# Patient Record
Sex: Female | Born: 1965 | Race: White | Hispanic: No | Marital: Married | Smoking: Never smoker
Health system: Southern US, Community
[De-identification: ages and names within clinical notes are randomized; demographics above are authoritative.]

## PROBLEM LIST (undated history)

## (undated) DIAGNOSIS — I10 Essential (primary) hypertension: Secondary | ICD-10-CM

## (undated) HISTORY — DX: Essential (primary) hypertension: I10

## (undated) HISTORY — PX: CHOLECYSTECTOMY: SHX55

## (undated) HISTORY — PX: POLYPECTOMY: SHX149

---

## 2014-09-19 ENCOUNTER — Ambulatory Visit (AMBULATORY_SURGERY_CENTER): Payer: Self-pay

## 2014-09-19 VITALS — Ht 67.0 in | Wt 264.6 lb

## 2014-09-19 DIAGNOSIS — Z800 Family history of malignant neoplasm of digestive organs: Secondary | ICD-10-CM

## 2014-09-19 MED ORDER — MOVIPREP 100 G PO SOLR: ORAL | Status: DC

## 2014-09-19 NOTE — Progress Notes (Signed)
Per pt, no allergies to soy or egg products.Pt not taking any weight loss meds or using  O2 at home. 

## 2014-10-02 ENCOUNTER — Ambulatory Visit (AMBULATORY_SURGERY_CENTER): Payer: PRIVATE HEALTH INSURANCE | Admitting: Gastroenterology

## 2014-10-02 ENCOUNTER — Encounter: Payer: Self-pay | Admitting: Gastroenterology

## 2014-10-02 VITALS — BP 121/73 | HR 61 | Temp 98.4°F | Resp 15 | Ht 67.0 in | Wt 264.0 lb

## 2014-10-02 DIAGNOSIS — Z1211 Encounter for screening for malignant neoplasm of colon: Secondary | ICD-10-CM

## 2014-10-02 DIAGNOSIS — Z800 Family history of malignant neoplasm of digestive organs: Secondary | ICD-10-CM | POA: Diagnosis not present

## 2014-10-02 DIAGNOSIS — K635 Polyp of colon: Secondary | ICD-10-CM

## 2014-10-02 DIAGNOSIS — D122 Benign neoplasm of ascending colon: Secondary | ICD-10-CM

## 2014-10-02 DIAGNOSIS — K621 Rectal polyp: Secondary | ICD-10-CM

## 2014-10-02 DIAGNOSIS — D120 Benign neoplasm of cecum: Secondary | ICD-10-CM | POA: Diagnosis not present

## 2014-10-02 DIAGNOSIS — D128 Benign neoplasm of rectum: Secondary | ICD-10-CM

## 2014-10-02 DIAGNOSIS — D129 Benign neoplasm of anus and anal canal: Secondary | ICD-10-CM

## 2014-10-02 HISTORY — PX: COLONOSCOPY: SHX174

## 2014-10-02 MED ORDER — SODIUM CHLORIDE 0.9 % IV SOLN: 500.0000 mL | INTRAVENOUS | Status: DC

## 2014-10-02 NOTE — Op Note (Signed)
[facility]  [address] [place], [id number]   COLONOSCOPY PROCEDURE REPORT  PATIENT: Angela Henry, Angela Henry  MR#: [id number] BIRTHDATE: 06-25-1965 , 83  yrs. old GENDER: female ENDOSCOPIST: Milus Banister, MD REFERRED BM:WUXL Vickii Penna, M.D. PROCEDURE DATE:  10/02/2014 PROCEDURE:   Colonoscopy, screening, Colonoscopy with cold biopsy polypectomy, and Colonoscopy with snare polypectomy First Screening Colonoscopy - Avg.  risk and is 50 yrs.  old or older Yes.  Prior Negative Screening - Now for repeat screening. N/A  History of Adenoma - Now for follow-up colonoscopy & has been > or = to 3 yrs.  N/A  Polyps removed today? Yes ASA CLASS:   Class II INDICATIONS:Screening for colonic neoplasia, FH Colon or Rectal Adenocarcinoma, and father with rectal cancer in early 60s. MEDICATIONS: Monitored anesthesia care, Propofol 450 mg IV, and lidocaine 40mg  IV  DESCRIPTION OF PROCEDURE:   After the risks benefits and alternatives of the procedure were thoroughly explained, informed consent was obtained.  The digital rectal exam revealed no abnormalities of the rectum.   The LB [id number]  endoscope was introduced through the anus and advanced to the cecum, which was identified by both the appendix and ileocecal valve. No adverse events experienced.   The quality of the prep was excellent.  The instrument was then slowly withdrawn as the colon was fully examined. Estimated blood loss is zero unless otherwise noted in this procedure report.  COLON FINDINGS: Four sessile polyps ranging between 3-25mm in size were found in the rectum, ascending colon, and at the cecum. Polypectomies were performed with a cold snare.  The resection was complete, the polyp tissue was completely retrieved and sent to histology.   A sessile polyp measuring 2 mm in size was found in the ascending colon.  A polypectomy was performed with cold forceps.  The resection was complete, the polyp tissue  was completely retrieved and sent to histology.   The examination was otherwise normal.  Retroflexed views revealed no abnormalities. The time to cecum = 2.8 Withdrawal time = 13.0   The scope was withdrawn and the procedure completed. COMPLICATIONS: There were no immediate complications.  ENDOSCOPIC IMPRESSION: 1.  Four sessile polyps ranging between 3-96mm in size were found in the rectum, ascending colon, and at the cecum; polypectomies were performed with a cold snare 2.   Sessile polyp was found in the ascending colon; polypectomy was performed with cold forceps 3.   The examination was otherwise normal  RECOMMENDATIONS: If the polyp(s) removed today are proven to be adenomatous (pre-cancerous) polyps, you will need a colonoscopy in 3-5 years You will receive a letter within 1-2 weeks with the results of your biopsy as well as final recommendations.  Please call my office if you have not received a letter after 3 weeks.  eSigned:  Milus Banister, MD 10/02/2014 10:26 AM     PATIENT NAME:  Angela Henry, Angela Henry MR#: [id number]

## 2014-10-02 NOTE — Progress Notes (Signed)
Stable to RR 

## 2014-10-02 NOTE — Progress Notes (Signed)
Called to room to assist during endoscopic procedure.  Patient ID and intended procedure confirmed with present staff. Received instructions for my participation in the procedure from the performing physician.  

## 2014-10-02 NOTE — Patient Instructions (Signed)
YOU HAD AN ENDOSCOPIC PROCEDURE TODAY AT THE [facility]:   Refer to the procedure report that was given to you for any specific questions about what was found during the examination.  If the procedure report does not answer your questions, please call your gastroenterologist to clarify.  If you requested that your care partner not be given the details of your procedure findings, then the procedure report has been included in a sealed envelope for you to review at your convenience later.  YOU SHOULD EXPECT: Some feelings of bloating in the abdomen. Passage of more gas than usual.  Walking can help get rid of the air that was put into your GI tract during the procedure and reduce the bloating. If you had a lower endoscopy (such as a colonoscopy or flexible sigmoidoscopy) you may notice spotting of blood in your stool or on the toilet paper. If you underwent a bowel prep for your procedure, you may not have a normal bowel movement for a few days.  Please Note:  You might notice some irritation and congestion in your nose or some drainage.  This is from the oxygen used during your procedure.  There is no need for concern and it should clear up in a day or so.  SYMPTOMS TO REPORT IMMEDIATELY:   Following lower endoscopy (colonoscopy or flexible sigmoidoscopy):  Excessive amounts of blood in the stool  Significant tenderness or worsening of abdominal pains  Swelling of the abdomen that is new, acute  Fever of 100F or higher  For urgent or emergent issues, a gastroenterologist can be reached at any hour by calling [phone].   DIET: Your first meal following the procedure should be a small meal and then it is ok to progress to your normal diet. Heavy or fried foods are harder to digest and may make you feel nauseous or bloated.  Likewise, meals heavy in dairy and vegetables can increase bloating.  Drink plenty of fluids but you should avoid alcoholic beverages for 24  hours.  ACTIVITY:  You should plan to take it easy for the rest of today and you should NOT DRIVE or use heavy machinery until tomorrow (because of the sedation medicines used during the test).    FOLLOW UP: Our staff will call the number listed on your records the next business day following your procedure to check on you and address any questions or concerns that you may have regarding the information given to you following your procedure. If we do not reach you, we will leave a message.  However, if you are feeling well and you are not experiencing any problems, there is no need to return our call.  We will assume that you have returned to your regular daily activities without incident.  If any biopsies were taken you will be contacted by phone or by letter within the next 1-3 weeks.  Please call us at [phone] if you have not heard about the biopsies in 3 weeks.    SIGNATURES/CONFIDENTIALITY: You and/or your care partner have signed paperwork which will be entered into your electronic medical record.  These signatures attest to the fact that that the information above on your After Visit Summary has been reviewed and is understood.  Full responsibility of the confidentiality of this discharge information lies with you and/or your care-partner.  Polyps-handout given   

## 2014-10-03 ENCOUNTER — Telehealth: Payer: Self-pay | Admitting: *Deleted

## 2014-10-03 NOTE — Telephone Encounter (Signed)
  Follow up Call-  Call back number 10/02/2014  Post procedure Call Back phone  # [phone]  Permission to leave phone message Yes     Patient questions:  Do you have a fever, pain , or abdominal swelling? No. Pain Score  0 *  Have you tolerated food without any problems? Yes.    Have you been able to return to your normal activities? Yes.    Do you have any questions about your discharge instructions: Diet   No. Medications  No. Follow up visit  No.  Do you have questions or concerns about your Care? No.  Actions: * If pain score is 4 or above: No action needed, pain <4.

## 2014-10-06 ENCOUNTER — Encounter: Payer: Self-pay | Admitting: Gastroenterology

## 2018-12-27 ENCOUNTER — Other Ambulatory Visit: Payer: Self-pay

## 2018-12-27 ENCOUNTER — Emergency Department (HOSPITAL_COMMUNITY): Payer: 59

## 2018-12-27 ENCOUNTER — Encounter (HOSPITAL_COMMUNITY): Payer: Self-pay | Admitting: *Deleted

## 2018-12-27 ENCOUNTER — Emergency Department (HOSPITAL_COMMUNITY)
Admission: EM | Admit: 2018-12-27 | Discharge: 2018-12-27 | Disposition: A | Discharge status: Home / Self Care | Payer: 59 | Attending: Emergency Medicine | Admitting: Emergency Medicine

## 2018-12-27 DIAGNOSIS — I10 Essential (primary) hypertension: Secondary | ICD-10-CM | POA: Insufficient documentation

## 2018-12-27 DIAGNOSIS — J1289 Other viral pneumonia: Secondary | ICD-10-CM | POA: Insufficient documentation

## 2018-12-27 DIAGNOSIS — U071 COVID-19: Secondary | ICD-10-CM | POA: Diagnosis present

## 2018-12-27 DIAGNOSIS — Z79899 Other long term (current) drug therapy: Secondary | ICD-10-CM | POA: Insufficient documentation

## 2018-12-27 LAB — BASIC METABOLIC PANEL
Anion gap: 12 (ref 5–15)
BUN: 11 mg/dL (ref 6–20)
CO2: 27 mmol/L (ref 22–32)
Calcium: 8.5 mg/dL — ABNORMAL LOW (ref 8.9–10.3)
Chloride: 97 mmol/L — ABNORMAL LOW (ref 98–111)
Creatinine, Ser: 0.75 mg/dL (ref 0.44–1.00)
GFR calc Af Amer: >60 mL/min (ref >60)
GFR calc non Af Amer: >60 mL/min (ref >60)
Glucose, Bld: 158 mg/dL — ABNORMAL HIGH (ref 70–99)
Potassium: 3.4 mmol/L — ABNORMAL LOW (ref 3.5–5.1)
Sodium: 136 mmol/L (ref 135–145)

## 2018-12-27 LAB — CBC
HCT: 42.9 % (ref 36.0–46.0)
Hemoglobin: 14.1 g/dL (ref 12.0–15.0)
MCH: 29.7 pg (ref 26.0–34.0)
MCHC: 32.9 g/dL (ref 30.0–36.0)
MCV: 90.3 fL (ref 80.0–100.0)
Platelets: 110 10*3/uL — ABNORMAL LOW (ref 150–400)
RBC: 4.75 MIL/uL (ref 3.87–5.11)
RDW: 12.7 % (ref 11.5–15.5)
WBC: 2.3 10*3/uL — ABNORMAL LOW (ref 4.0–10.5)
nRBC: 0 % (ref 0.0–0.2)

## 2018-12-27 MED ORDER — ONDANSETRON 4 MG PO TBDP
4.0000 mg | ORAL_TABLET | Freq: Once | ORAL | Status: DC
  Filled 2018-12-27: qty 1

## 2018-12-27 MED ORDER — DOXYCYCLINE HYCLATE 100 MG PO TABS: 100.0000 mg | ORAL_TABLET | Freq: Two times a day (BID) | ORAL | 0 refills | Status: DC

## 2018-12-27 MED ORDER — BENZONATATE 100 MG PO CAPS: 100.0000 mg | ORAL_CAPSULE | Freq: Three times a day (TID) | ORAL | 0 refills | Status: DC

## 2018-12-27 NOTE — ED Provider Notes (Signed)
[facility] EMERGENCY DEPARTMENT Provider Note   CSN: [id number] Arrival date & time: 12/27/18  [id number]     History Chief Complaint  Patient presents with  . Fever    Angela Henry is a 53 y.o. female.  HPI   Patient presents to the emergency room for evaluation of persistent symptoms associated with COVID-19.  Patient states she started having symptoms over a week ago.  She has been coughing and she has felt short of breath.  She has had nausea associated with dry heaves and loose stools.  Patient continues to have fevers up to 101,102.  She was tested last week and was informed of the positive test result yesterday.  Patient has been taking vitamin D, zinc and vitamin C.  She continue to feel poorly this morning.  She checked a pulse ox at home and it read in the high 80s.  She tried to take deep breaths but it did not improve so she came to the ED for evaluation.  Past Medical History:  Diagnosis Date  . Hypertension     There are no problems to display for this patient.   Past Surgical History:  Procedure Laterality Date  . CHOLECYSTECTOMY       OB History   No obstetric history on file.     Family History  Problem Relation Age of Onset  . Rectal cancer Father     Social History   Tobacco Use  . Smoking status: Never Smoker  . Smokeless tobacco: Never Used  Substance Use Topics  . Alcohol use: No    Alcohol/week: 0.0 standard drinks  . Drug use: No    Home Medications Prior to Admission medications   Medication Sig Start Date End Date Taking? Authorizing Provider  benzonatate (TESSALON) 100 MG capsule Take 1 capsule (100 mg total) by mouth every 8 (eight) hours. 12/27/18   Dorie Rank, MD  doxycycline (VIBRA-TABS) 100 MG tablet Take 1 tablet (100 mg total) by mouth 2 (two) times daily. 12/27/18   Dorie Rank, MD  hydrochlorothiazide (HYDRODIURIL) 25 MG tablet Take 25 mg by mouth daily.    [provider]    Allergies    Patient has no known  allergies.  Review of Systems   Review of Systems  All other systems reviewed and are negative.   Physical Exam Updated Vital Signs BP 128/61   Pulse (!) 104   Temp 99.5 F (37.5 C) (Oral)   Resp 20   Ht 1.715 m (5' 7.5")   Wt 104.3 kg   SpO2 96%   BMI 35.49 kg/m   Physical Exam Vitals and nursing note reviewed.  Constitutional:      General: She is not in acute distress.    Appearance: She is well-developed.  HENT:     Head: Normocephalic and atraumatic.     Right Ear: External ear normal.     Left Ear: External ear normal.  Eyes:     General: No scleral icterus.       Right eye: No discharge.        Left eye: No discharge.     Conjunctiva/sclera: Conjunctivae normal.  Neck:     Trachea: No tracheal deviation.  Cardiovascular:     Rate and Rhythm: Normal rate and regular rhythm.  Pulmonary:     Effort: Pulmonary effort is normal. No respiratory distress.     Breath sounds: Normal breath sounds. No stridor. No wheezing or rales.  Abdominal:  General: Bowel sounds are normal. There is no distension.     Palpations: Abdomen is soft.     Tenderness: There is no abdominal tenderness. There is no guarding or rebound.  Musculoskeletal:        General: No tenderness.     Cervical back: Neck supple.  Skin:    General: Skin is warm and dry.     Findings: No rash.  Neurological:     Mental Status: She is alert.     Cranial Nerves: No cranial nerve deficit (no facial droop, extraocular movements intact, no slurred speech).     Sensory: No sensory deficit.     Motor: No abnormal muscle tone or seizure activity.     Coordination: Coordination normal.     ED Results / Procedures / Treatments   Labs (all labs ordered are listed, but only abnormal results are displayed) Labs Reviewed  CBC - Abnormal; Notable for the following components:      Result Value   WBC 2.3 (*)    Platelets 110 (*)    All other components within normal limits  BASIC METABOLIC PANEL -  Abnormal; Notable for the following components:   Potassium 3.4 (*)    Chloride 97 (*)    Glucose, Bld 158 (*)    Calcium 8.5 (*)    All other components within normal limits    EKG None  Radiology DG Chest Portable 1 View  Result Date: 12/27/2018 CLINICAL DATA:  Recent positive test for COVID-19. Increasing shortness of breath, fever and hypoxia. EXAM: PORTABLE CHEST 1 VIEW COMPARISON:  None. FINDINGS: The heart size is normal. The aorta shows mild tortuosity. There are patchy areas of irregular peripheral airspace infiltrate in the both upper lobes and both lung bases. No edema, pleural effusions or pneumothorax. IMPRESSION: Bilateral pulmonary infiltrates. Electronically Signed   By: Aletta Edouard M.D.   On: 12/27/2018 07:55    Procedures Procedures (including critical care time)  Medications Ordered in ED Medications - No data to display  ED Course  I have reviewed the triage vital signs and the nursing notes.  Pertinent labs & imaging results that were available during my care of the patient were reviewed by me and considered in my medical decision making (see chart for details).    MDM Rules/Calculators/A&P                      Pt with known covid 19 illness.  Labs notable for leukopenia likely related to viral illness.  Mild electrolyte abnormalities.  Pt tolerating po.  Breathing easily without suplemental oxygen despite pneumonia finding on cxr.  Sx ongong for over 1 week.  Most likely the viral pneumonia but will cover with doxycyline for possible bacterial superinfection.  Stable for continued outpt management.  Precautions discussed Final Clinical Impression(s) / ED Diagnoses Final diagnoses:  Pneumonia due to COVID-19 virus    Rx / DC Orders ED Discharge Orders         Ordered    benzonatate (TESSALON) 100 MG capsule  Every 8 hours     12/27/18 1040    doxycycline (VIBRA-TABS) 100 MG tablet  2 times daily     12/27/18 1040           Dorie Rank, MD  12/28/18 1308

## 2018-12-27 NOTE — Discharge Instructions (Addendum)
Follow-up with your primary care doctor.  Return to the ED for worsening shortness of breath.  Your oxygen saturation was normal here but your x-ray does show some signs of infection in the lungs.  This is most likely from the COVID-19 but I will start you on antibiotics in case there is a component of bacterial pneumonia as well.  Continue to take Tylenol or ibuprofen to help with fevers and chills.

## 2018-12-27 NOTE — ED Notes (Signed)
Pt ambulated inside of room. O2 levels stayed above 95%

## 2018-12-27 NOTE — ED Triage Notes (Signed)
Pt states that she tested positive for covid recently and has been getting worse, c/o increasing sob, pulse ox running 89% at home, continued fevers, pulse ox 97% in triage on room air,

## 2020-02-23 ENCOUNTER — Encounter: Payer: Self-pay | Admitting: Gastroenterology

## 2020-07-17 ENCOUNTER — Encounter: Payer: Self-pay | Admitting: Gastroenterology

## 2020-10-22 ENCOUNTER — Ambulatory Visit (AMBULATORY_SURGERY_CENTER): Payer: 59 | Admitting: *Deleted

## 2020-10-22 ENCOUNTER — Other Ambulatory Visit: Payer: Self-pay

## 2020-10-22 ENCOUNTER — Encounter: Payer: Self-pay | Admitting: Gastroenterology

## 2020-10-22 ENCOUNTER — Encounter: Payer: 59 | Admitting: Gastroenterology

## 2020-10-22 VITALS — Ht 67.0 in | Wt 244.0 lb

## 2020-10-22 DIAGNOSIS — Z86010 Personal history of colonic polyps: Secondary | ICD-10-CM

## 2020-10-22 DIAGNOSIS — Z800 Family history of malignant neoplasm of digestive organs: Secondary | ICD-10-CM

## 2020-10-22 MED ORDER — PLENVU 140 G PO SOLR: 1.0000 | Freq: Once | ORAL | 0 refills | Status: AC

## 2020-10-22 NOTE — Progress Notes (Signed)
Patient's pre-visit was done today over the phone with the patient due to COVID-19 pandemic. Name,DOB and address verified. Patient denies any allergies to Eggs and Soy. Patient denies any problems with anesthesia/sedation. Patient is not taking any diet pills or blood thinners. No home Oxygen. Packet of Prep instructions mailed to patient including a copy of a consent form-pt is aware. Patient understands to call us back with any questions or concerns. Patient is aware of our care-partner policy and [id number] safety protocol.   The patient is COVID-19 vaccinated.

## 2020-11-05 ENCOUNTER — Other Ambulatory Visit: Payer: Self-pay | Admitting: Gastroenterology

## 2020-11-05 ENCOUNTER — Encounter: Payer: Self-pay | Admitting: Gastroenterology

## 2020-11-05 ENCOUNTER — Other Ambulatory Visit: Payer: Self-pay

## 2020-11-05 ENCOUNTER — Ambulatory Visit (AMBULATORY_SURGERY_CENTER): Payer: 59 | Admitting: Gastroenterology

## 2020-11-05 VITALS — BP 122/79 | HR 68 | Temp 97.3°F | Resp 14 | Ht 67.0 in | Wt 244.0 lb

## 2020-11-05 DIAGNOSIS — D120 Benign neoplasm of cecum: Secondary | ICD-10-CM | POA: Diagnosis not present

## 2020-11-05 DIAGNOSIS — Z86010 Personal history of colonic polyps: Secondary | ICD-10-CM

## 2020-11-05 DIAGNOSIS — Z800 Family history of malignant neoplasm of digestive organs: Secondary | ICD-10-CM | POA: Diagnosis not present

## 2020-11-05 DIAGNOSIS — D123 Benign neoplasm of transverse colon: Secondary | ICD-10-CM

## 2020-11-05 MED ORDER — SODIUM CHLORIDE 0.9 % IV SOLN: 500.0000 mL | Freq: Once | INTRAVENOUS | Status: DC

## 2020-11-05 NOTE — Progress Notes (Signed)
HPI: This is a woman with FH of CRC, personal h/o polyps   ROS: complete GI ROS as described in HPI, all other review negative.  Constitutional:  No unintentional weight loss   Past Medical History:  Diagnosis Date   Hypertension    no meds    Past Surgical History:  Procedure Laterality Date   CHOLECYSTECTOMY     COLONOSCOPY  10/02/2014   Dr.Nataline Basara   POLYPECTOMY      Current Outpatient Medications  Medication Sig Dispense Refill   Multiple Vitamin (MULTIVITAMIN) tablet Take 1 tablet by mouth daily.     VITAMIN D PO Take by mouth.     Current Facility-Administered Medications  Medication Dose Route Frequency Provider Last Rate Last Admin   0.9 %  sodium chloride infusion  500 mL Intravenous Once Milus Banister, MD        Allergies as of 11/05/2020   (No Known Allergies)    Family History  Problem Relation Age of Onset   Colon cancer Father        late 75's   Rectal cancer Father    Colon polyps Neg Hx    Esophageal cancer Neg Hx    Stomach cancer Neg Hx     Social History   Socioeconomic History   Marital status: Married    Spouse name: Not on file   Number of children: Not on file   Years of education: Not on file   Highest education level: Not on file  Occupational History   Not on file  Tobacco Use   Smoking status: Never   Smokeless tobacco: Never  Vaping Use   Vaping Use: Never used  Substance and Sexual Activity   Alcohol use: No    Alcohol/week: 0.0 standard drinks   Drug use: No   Sexual activity: Not on file  Other Topics Concern   Not on file  Social History Narrative   Not on file   Social Determinants of Health   Financial Resource Strain: Not on file  Food Insecurity: Not on file  Transportation Needs: Not on file  Physical Activity: Not on file  Stress: Not on file  Social Connections: Not on file  Intimate Partner Violence: Not on file     Physical Exam: BP 138/64   Pulse 74   Temp (!) 97.3 F (36.3 C) (Temporal)    Ht 5\' 7"  (1.702 m)   Wt 244 lb (110.7 kg)   LMP 06/12/2014   SpO2 98%   BMI 38.22 kg/m  Constitutional: generally well-appearing Psychiatric: alert and oriented x3 Lungs: CTA bilaterally Heart: no MCR  Assessment and plan: 55 y.o. female with FH of CRC, personal h/o polyps  Colonoscopy today  Care is appropriate for the ambulatory setting.  Owens Loffler, MD [facility] 11/05/2020, 8:45 AM

## 2020-11-05 NOTE — Progress Notes (Signed)
VS completed by DT.  Pt's states no medical or surgical changes since previsit or office visit.  

## 2020-11-05 NOTE — Progress Notes (Signed)
Called to room to assist during endoscopic procedure.  Patient ID and intended procedure confirmed with present staff. Received instructions for my participation in the procedure from the performing physician.  

## 2020-11-05 NOTE — Progress Notes (Signed)
To PACU, VSS. Report to [email or web address] 

## 2020-11-05 NOTE — Patient Instructions (Signed)
Resume previous diet and continue present medications. Awaiting pathology results. Repeat colonoscopy date to be determined based on pathology results.  YOU HAD AN ENDOSCOPIC PROCEDURE TODAY AT [facility]:   Refer to the procedure report that was given to you for any specific questions about what was found during the examination.  If the procedure report does not answer your questions, please call your gastroenterologist to clarify.  If you requested that your care partner not be given the details of your procedure findings, then the procedure report has been included in a sealed envelope for you to review at your convenience later.  YOU SHOULD EXPECT: Some feelings of bloating in the abdomen. Passage of more gas than usual.  Walking can help get rid of the air that was put into your GI tract during the procedure and reduce the bloating. If you had a lower endoscopy (such as a colonoscopy or flexible sigmoidoscopy) you may notice spotting of blood in your stool or on the toilet paper. If you underwent a bowel prep for your procedure, you may not have a normal bowel movement for a few days.  Please Note:  You might notice some irritation and congestion in your nose or some drainage.  This is from the oxygen used during your procedure.  There is no need for concern and it should clear up in a day or so.  SYMPTOMS TO REPORT IMMEDIATELY:  Following lower endoscopy (colonoscopy or flexible sigmoidoscopy):  Excessive amounts of blood in the stool  Significant tenderness or worsening of abdominal pains  Swelling of the abdomen that is new, acute  Fever of 100F or higher   For urgent or emergent issues, a gastroenterologist can be reached at any hour by calling [phone]. Do not use MyChart messaging for urgent concerns.    DIET:  We do recommend a small meal at first, but then you may proceed to your regular diet.  Drink plenty of fluids but you should avoid alcoholic beverages  for 24 hours.  ACTIVITY:  You should plan to take it easy for the rest of today and you should NOT DRIVE or use heavy machinery until tomorrow (because of the sedation medicines used during the test).    FOLLOW UP: Our staff will call the number listed on your records 48-72 hours following your procedure to check on you and address any questions or concerns that you may have regarding the information given to you following your procedure. If we do not reach you, we will leave a message.  We will attempt to reach you two times.  During this call, we will ask if you have developed any symptoms of COVID 19. If you develop any symptoms (ie: fever, flu-like symptoms, shortness of breath, cough etc.) before then, please call [phone].  If you test positive for Covid 19 in the 2 weeks post procedure, please call and report this information to [place].    If any biopsies were taken you will be contacted by phone or by letter within the next 1-3 weeks.  Please call us at [phone] if you have not heard about the biopsies in 3 weeks.    SIGNATURES/CONFIDENTIALITY: You and/or your care partner have signed paperwork which will be entered into your electronic medical record.  These signatures attest to the fact that that the information above on your After Visit Summary has been reviewed and is understood.  Full responsibility of the confidentiality of this discharge information lies with you and/or  your care-partner.

## 2020-11-05 NOTE — Op Note (Signed)
[name] Procedure Date: 11/05/2020 8:46 AM MRN: [id number] Endoscopist: Milus Banister , MD Age: 55 Referring MD:  Date of Birth: [birth year] Gender: Female Account #: [id number] Procedure:                Colonoscopy Indications:              High risk colon cancer surveillance: Personal                            history of colonic polyps and father had CRC in his                            8s: colonoscopy 2016 5 subCM polyps, one was a TA Medicines:                Monitored Anesthesia Care Procedure:                Pre-Anesthesia Assessment:                           - Prior to the procedure, a History and Physical                            was performed, and patient medications and                            allergies were reviewed. The patient's tolerance of                            previous anesthesia was also reviewed. The risks                            and benefits of the procedure and the sedation                            options and risks were discussed with the patient.                            All questions were answered, and informed consent                            was obtained. Prior Anticoagulants: The patient has                            taken no previous anticoagulant or antiplatelet                            agents. ASA Grade Assessment: II - A patient with                            mild systemic disease. After reviewing the risks                            and benefits, the patient was deemed in  satisfactory condition to undergo the procedure.                           After obtaining informed consent, the colonoscope                            was passed under direct vision. Throughout the                            procedure, the patient's blood pressure, pulse, and                            oxygen saturations were monitored continuously. The                            Olympus  CF-HQ190L [id number]) Colonoscope was                            introduced through the anus and advanced to the the                            cecum, identified by appendiceal orifice and                            ileocecal valve. The colonoscopy was performed                            without difficulty. The patient tolerated the                            procedure well. The quality of the bowel                            preparation was good. The ileocecal valve,                            appendiceal orifice, and rectum were photographed. Scope In: 8:54:38 AM Scope Out: 9:06:28 AM Scope Withdrawal Time: 0 hours 8 minutes 52 seconds  Total Procedure Duration: 0 hours 11 minutes 50 seconds  Findings:                 A 2 mm polyp was found in the cecum. The polyp was                            sessile. The polyp was removed with a cold biopsy                            forceps. Resection and retrieval were complete.                           Two sessile polyps were found in the transverse                            colon. The polyps were 3 to  5 mm in size. These                            polyps were removed with a cold snare. Resection                            and retrieval were complete.                           The exam was otherwise without abnormality on                            direct and retroflexion views. Complications:            No immediate complications. Estimated blood loss:                            None. Estimated Blood Loss:     Estimated blood loss was minimal. Impression:               - One 2 mm polyp in the cecum, removed with a cold                            biopsy forceps. Resected and retrieved.                           - Two 3 to 5 mm polyps in the transverse colon,                            removed with a cold snare. Resected and retrieved.                           - The examination was otherwise normal on direct                            and  retroflexion views. Recommendation:           - Patient has a contact number available for                            emergencies. The signs and symptoms of potential                            delayed complications were discussed with the                            patient. Return to normal activities tomorrow.                            Written discharge instructions were provided to the                            patient.                           - Resume previous diet.                           -  Continue present medications.                           - Await pathology results. Milus Banister, MD 11/05/2020 9:08:39 AM This report has been signed electronically.

## 2020-11-07 ENCOUNTER — Telehealth: Payer: Self-pay

## 2020-11-07 NOTE — Telephone Encounter (Signed)
  Follow up Call-  Call back number 11/05/2020  Post procedure Call Back phone  # [phone]  Permission to leave phone message Yes  Some recent data might be hidden     Patient questions:  Do you have a fever, pain , or abdominal swelling? No. Pain Score  0 *  Have you tolerated food without any problems? Yes.    Have you been able to return to your normal activities? Yes.    Do you have any questions about your discharge instructions: Diet   No. Medications  No. Follow up visit  No.  Do you have questions or concerns about your Care? No.  Actions: * If pain score is 4 or above: No action needed, pain <4.  Have you developed a fever since your procedure? no  2.   Have you had an respiratory symptoms (SOB or cough) since your procedure? no  3.   Have you tested positive for COVID 19 since your procedure no  4.   Have you had any family members/close contacts diagnosed with the COVID 19 since your procedure?  no   If yes to any of these questions please route to Joylene John, RN and Joella Prince, RN

## 2020-11-09 ENCOUNTER — Encounter: Payer: Self-pay | Admitting: Gastroenterology

## 2020-12-19 IMAGING — DX DG CHEST 1V PORT
1 series · 1 of 1 positions shown · non-contrast
Comparison: None.

CLINICAL DATA: Recent positive test for LDIE7-53. Increasing
shortness of breath, fever and hypoxia.

EXAM:
PORTABLE CHEST 1 VIEW

[chest ap]
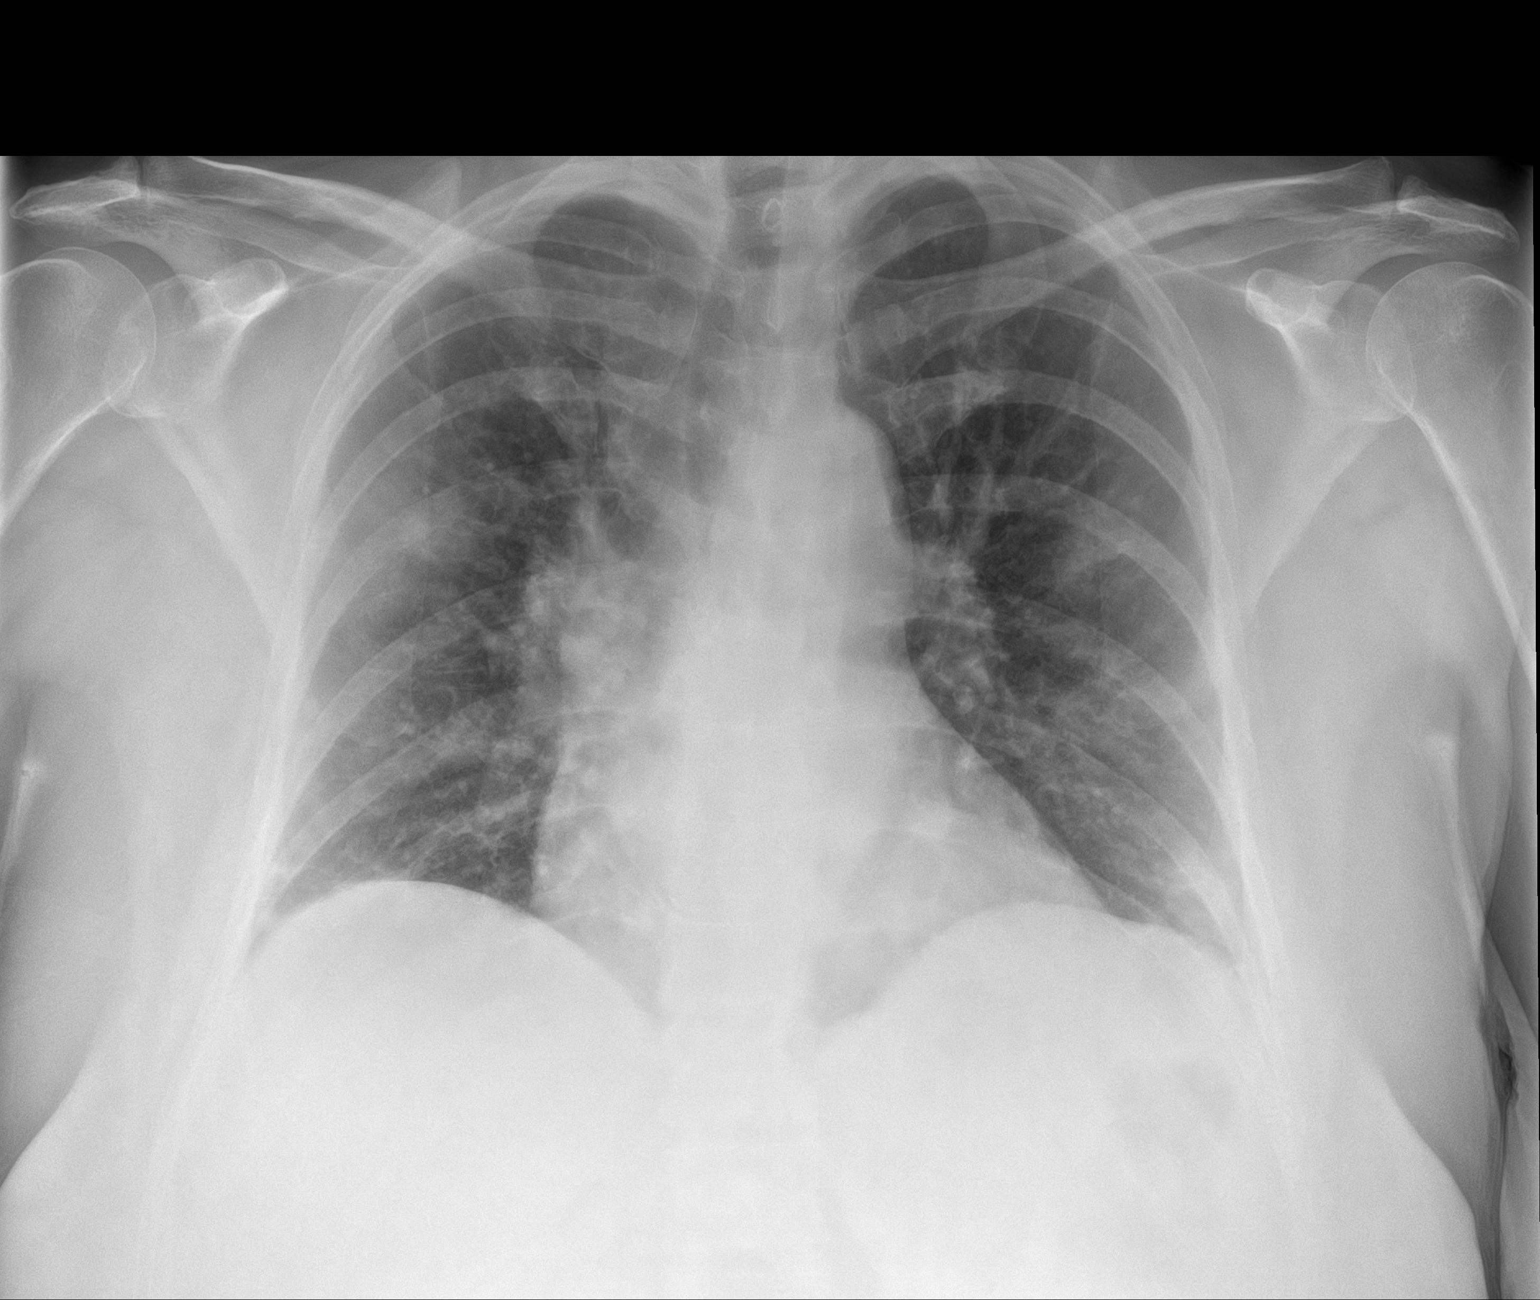

[1 of 1 positions shown; findings below may reference images not displayed]

FINDINGS: The heart size is normal. The aorta shows mild tortuosity. There are
patchy areas of irregular peripheral airspace infiltrate in the both
upper lobes and both lung bases. No edema, pleural effusions or
pneumothorax.
IMPRESSION: Bilateral pulmonary infiltrates.
